# Patient Record
Sex: Male | Born: 1995 | Race: Black or African American | Hispanic: No | Smoking: Never smoker
Health system: Southern US, Community
[De-identification: ages and names within clinical notes are randomized; demographics above are authoritative.]

---
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
## 2019-09-06 ENCOUNTER — Other Ambulatory Visit: Payer: Self-pay

## 2019-09-06 ENCOUNTER — Emergency Department (HOSPITAL_BASED_OUTPATIENT_CLINIC_OR_DEPARTMENT_OTHER)
Admission: EM | Admit: 2019-09-06 | Discharge: 2019-09-06 | Disposition: A | Discharge status: Home / Self Care | Payer: Managed Care, Other (non HMO) | Attending: Emergency Medicine | Admitting: Emergency Medicine

## 2019-09-06 ENCOUNTER — Emergency Department (HOSPITAL_BASED_OUTPATIENT_CLINIC_OR_DEPARTMENT_OTHER): Payer: Managed Care, Other (non HMO)

## 2019-09-06 ENCOUNTER — Encounter (HOSPITAL_BASED_OUTPATIENT_CLINIC_OR_DEPARTMENT_OTHER): Payer: Self-pay | Admitting: Emergency Medicine

## 2019-09-06 DIAGNOSIS — S9781XA Crushing injury of right foot, initial encounter: Secondary | ICD-10-CM | POA: Diagnosis present

## 2019-09-06 DIAGNOSIS — Y939 Activity, unspecified: Secondary | ICD-10-CM | POA: Diagnosis not present

## 2019-09-06 DIAGNOSIS — Y929 Unspecified place or not applicable: Secondary | ICD-10-CM | POA: Insufficient documentation

## 2019-09-06 DIAGNOSIS — W231XXA Caught, crushed, jammed, or pinched between stationary objects, initial encounter: Secondary | ICD-10-CM | POA: Diagnosis not present

## 2019-09-06 DIAGNOSIS — S90811A Abrasion, right foot, initial encounter: Secondary | ICD-10-CM | POA: Diagnosis not present

## 2019-09-06 DIAGNOSIS — Y990 Civilian activity done for income or pay: Secondary | ICD-10-CM | POA: Diagnosis not present

## 2019-09-06 MED ORDER — HYDROCODONE-ACETAMINOPHEN 5-325 MG PO TABS: 1.0000 | ORAL_TABLET | Freq: Four times a day (QID) | ORAL | 0 refills | Status: AC | PRN

## 2019-09-06 MED ORDER — NAPROXEN 250 MG PO TABS
500.0000 mg | ORAL_TABLET | Freq: Once | ORAL | Status: AC
  Administered 2019-09-06: 500 mg via ORAL
  Filled 2019-09-06: qty 2

## 2019-09-06 NOTE — ED Triage Notes (Signed)
Pt has injury to right foot from being crushed between a pallet jack and wall. Pt has skin tear to lateral foot.

## 2019-09-06 NOTE — ED Provider Notes (Signed)
[facility] Provider Note: Georgena Spurling, MD, FACEP  CSN: [id number] MRN: [id number] ARRIVAL: 09/06/19 at [place]: [place] [facility]   HISTORY OF PRESENT ILLNESS  09/06/19 1:38 AM Erik Reynolds is a 24 y.o. male who his right foot got caught between a pallet jack and a wall at work just prior to arrival.  He has an abrasion overlying his right first metatarsal.  He has associated pain in his right foot which he rates as a 9 out of 10 and describes it as sharp and throbbing.  It is worse with palpation or movement.  His tetanus is up-to-date.   History reviewed. No pertinent past medical history.  History reviewed. No pertinent surgical history.  No family history on file.  Social History   Tobacco Use  . Smoking status: Never Smoker  . Smokeless tobacco: Never Used  Substance Use Topics  . Alcohol use: No  . Drug use: No    Prior to Admission medications   Medication Sig Start Date End Date Taking? Authorizing Provider  HYDROcodone-acetaminophen (NORCO) 5-325 MG tablet Take 1-2 tablets by mouth every 6 (six) hours as needed for severe pain. 09/06/19   Breda Bond, Jenny Reichmann, MD    Allergies Patient has no known allergies.   REVIEW OF SYSTEMS  Negative except as noted here or in the History of Present Illness.   PHYSICAL EXAMINATION  Initial Vital Signs Blood pressure (!) 132/95, pulse 79, temperature 98.4 F (36.9 C), temperature source Oral, resp. rate 18, height 6' (1.829 m), weight 77.1 kg, SpO2 100 %.  Examination General: Well-developed, well-nourished male in no acute distress; appearance consistent with age of record HENT: normocephalic; atraumatic Eyes: Normal appearance Neck: supple Heart: regular rate and rhythm Lungs: clear to auscultation bilaterally Abdomen: soft; nondistended; nontender; bowel sounds present Extremities: No deformity; pulses normal; swelling, abrasion and tenderness overlying right first metatarsal,  right foot distally neurovascularly intact with intact tendon function except limited exam of great toe due to pain and swelling Neurologic: Awake, alert and oriented; motor function intact in all extremities and symmetric; no facial droop Skin: Warm and dry Psychiatric: Normal mood and affect   RESULTS  Summary of this visit's results, reviewed and interpreted by myself:   EKG Interpretation  Date/Time:    Ventricular Rate:    PR Interval:    QRS Duration:   QT Interval:    QTC Calculation:   R Axis:     Text Interpretation:        Laboratory Studies: No results found for this or any previous visit (from the past 24 hour(s)). Imaging Studies: DG Foot Complete Right  Result Date: 09/06/2019 CLINICAL DATA:  Right foot pain after injury. Foot crushed between a pallet jack and wall. Skin tear. EXAM: RIGHT FOOT COMPLETE - 3+ VIEW COMPARISON:  Foot radiograph 12/22/2011 FINDINGS: There is no evidence of fracture or dislocation. There is no evidence of arthropathy or other focal bone abnormality. Dorsal soft tissue edema overlies the metatarsals. IMPRESSION: Dorsal soft tissue edema. No fracture or dislocation. Electronically Signed   By: Keith Rake M.D.   On: 09/06/2019 01:40    ED COURSE and MDM  Nursing notes, initial and subsequent vitals signs, including pulse oximetry, reviewed and interpreted by myself.  Vitals:   09/06/19 0111 09/06/19 0112 09/06/19 0114  BP:   (!) 132/95  Pulse:   79  Resp:   18  Temp:  98.4 F (36.9 C)   TempSrc:  Oral   SpO2:   100%  Weight: 77.1 kg    Height: 6' (1.829 m)     Medications  naproxen (NAPROSYN) tablet 500 mg (has no administration in time range)   No signs of a compartment syndrome at this time but this is a crush injury and patient was advised of concerning signs and symptoms that would warrant return.  Specifically he was advised to watch for pallor, paresthesia, increasing pain.  He was advised to keep the foot elevated and  to use ice.   PROCEDURES  Procedures   ED DIAGNOSES     ICD-10-CM   1. Crushing injury of right foot, initial encounter  S97.81XA   2. Abrasion, right foot, initial encounter  G64.403K        Paula Libra, MD 09/06/19 0150

## 2020-04-17 ENCOUNTER — Emergency Department (HOSPITAL_COMMUNITY)
Admission: EM | Admit: 2020-04-17 | Discharge: 2020-04-17 | Disposition: A | Discharge status: Home / Self Care | Payer: No Typology Code available for payment source | Attending: Emergency Medicine | Admitting: Emergency Medicine

## 2020-04-17 ENCOUNTER — Encounter (HOSPITAL_COMMUNITY): Payer: Self-pay

## 2020-04-17 ENCOUNTER — Emergency Department (HOSPITAL_COMMUNITY): Payer: No Typology Code available for payment source

## 2020-04-17 DIAGNOSIS — U071 COVID-19: Secondary | ICD-10-CM | POA: Diagnosis not present

## 2020-04-17 DIAGNOSIS — M79642 Pain in left hand: Secondary | ICD-10-CM | POA: Diagnosis not present

## 2020-04-17 DIAGNOSIS — R6883 Chills (without fever): Secondary | ICD-10-CM | POA: Diagnosis present

## 2020-04-17 LAB — SARS CORONAVIRUS 2 BY RT PCR (HOSPITAL ORDER, PERFORMED IN ~~LOC~~ HOSPITAL LAB): SARS Coronavirus 2: POSITIVE — AB

## 2020-04-17 MED ORDER — ACETAMINOPHEN 325 MG PO TABS
650.0000 mg | ORAL_TABLET | Freq: Once | ORAL | Status: AC
  Administered 2020-04-17: 650 mg via ORAL

## 2020-04-17 MED ORDER — BENZONATATE 100 MG PO CAPS: 100.0000 mg | ORAL_CAPSULE | Freq: Three times a day (TID) | ORAL | 0 refills | Status: AC

## 2020-04-17 NOTE — ED Provider Notes (Signed)
[facility] EMERGENCY DEPARTMENT Provider Note   CSN: [id number] Arrival date & time: 04/17/20  1701     History Chief Complaint  Patient presents with  . Chills  . Hand Pain    Erik Reynolds is a 24 y.o. male who presents to the ED today with complaint of gradual onset, constant, dry cough x 2 days. Pt also complains of chills and body aches. He is unsure if  He has been around anyone who is sick however he is not vaccinated. Pt was unaware he had a fever at home however found to have a temp of 102.5 in the ED today. Pt denies any shortness of breath, chest pain, sore throat, abdominal pain, nausea, vomiting, diarrhea.   Pt also complaining of gradual onset, constant, achy, left hand pain x 3 days. Pt reports that he does competitive fighting and hurt his hand 3 days ago. He states he went home that day and noticed swelling of his hand. He has been unable to move the 3rd finger due to swelling and pain. Pt did have a small wound on his finger; tetanus up to date. Pt denies any other symptoms.   The history is provided by the patient and medical records.       History reviewed. No pertinent past medical history.  There are no problems to display for this patient.   History reviewed. No pertinent surgical history.     History reviewed. No pertinent family history.  Social History   Tobacco Use  . Smoking status: Never Smoker  . Smokeless tobacco: Never Used  Substance Use Topics  . Alcohol use: No  . Drug use: No    Home Medications Prior to Admission medications   Medication Sig Start Date End Date Taking? Authorizing Provider  benzonatate (TESSALON) 100 MG capsule Take 1 capsule (100 mg total) by mouth every 8 (eight) hours. 04/17/20   Tanda Rockers, PA-C  HYDROcodone-acetaminophen (NORCO) 5-325 MG tablet Take 1-2 tablets by mouth every 6 (six) hours as needed for severe pain. 09/06/19   Molpus, Jonny Ruiz, MD    Allergies    Patient has no known  allergies.  Review of Systems   Review of Systems  Constitutional: Positive for chills, fatigue and fever.  Respiratory: Positive for cough. Negative for shortness of breath.   Cardiovascular: Negative for chest pain.  Gastrointestinal: Negative for abdominal pain, diarrhea, nausea and vomiting.  Musculoskeletal: Positive for arthralgias and myalgias.  Skin: Positive for wound (healing).  All other systems reviewed and are negative.   Physical Exam Updated Vital Signs BP (!) 128/56 (BP Location: Right Arm)   Pulse 98   Temp (!) 102.5 F (39.2 C)   Resp 20   Ht 6' (1.829 m)   Wt 78 kg   SpO2 98%   BMI 23.33 kg/m   Physical Exam Vitals and nursing note reviewed.  Constitutional:      Appearance: He is not ill-appearing or diaphoretic.  HENT:     Head: Normocephalic and atraumatic.  Eyes:     Conjunctiva/sclera: Conjunctivae normal.  Cardiovascular:     Rate and Rhythm: Normal rate and regular rhythm.     Pulses: Normal pulses.  Pulmonary:     Effort: Pulmonary effort is normal.     Breath sounds: Normal breath sounds. No wheezing, rhonchi or rales.     Comments: Actively coughing in room. Able to speak in full sentences without difficulty. Satting 98% on RA.  Musculoskeletal:  Comments: Swelling and ecchymosis noted to 3rd MCP joint with TTP. ROM limited at joint due to pain. ROM intact to PIP and DIP joint of same finger. No tenderness to remainder of hand or other fingers. Cap refill < 2 seconds. 2+ radial pulse.   Skin:    General: Skin is warm and dry.     Coloration: Skin is not jaundiced.  Neurological:     Mental Status: He is alert.     ED Results / Procedures / Treatments   Labs (all labs ordered are listed, but only abnormal results are displayed) Labs Reviewed  SARS CORONAVIRUS 2 BY RT PCR (HOSPITAL ORDER, PERFORMED IN [facility]) - Abnormal; Notable for the following components:      Result Value   SARS Coronavirus 2 POSITIVE (*)     All other components within normal limits    EKG None  Radiology DG Chest Port 1 View  Result Date: 04/17/2020 CLINICAL DATA:  COVID positive with cough. EXAM: PORTABLE CHEST 1 VIEW COMPARISON:  None. FINDINGS: There is no evidence of acute infiltrate, pleural effusion or pneumothorax. The heart size and mediastinal contours are within normal limits. The visualized skeletal structures are unremarkable. IMPRESSION: No active disease. Electronically Signed   By: Aram Candela M.D.   On: 04/17/2020 20:09   DG Hand Complete Left  Result Date: 04/17/2020 CLINICAL DATA:  Status post trauma. EXAM: LEFT HAND - COMPLETE 3+ VIEW COMPARISON:  None. FINDINGS: There is no evidence of fracture or dislocation. There is no evidence of arthropathy or other focal bone abnormality. Mild dorsal soft tissue swelling is seen along the metacarpal phalangeal articulations. IMPRESSION: Mild dorsal soft tissue swelling without evidence of acute fracture. Electronically Signed   By: Aram Candela M.D.   On: 04/17/2020 20:07    Procedures Procedures (including critical care time)  Medications Ordered in ED Medications  acetaminophen (TYLENOL) tablet 650 mg (650 mg Oral Given 04/17/20 1719)    ED Course  I have reviewed the triage vital signs and the nursing notes.  Pertinent labs & imaging results that were available during my care of the patient were reviewed by me and considered in my medical decision making (see chart for details).  Clinical Course as of Apr 17 2114  Wed Apr 17, 2020  1917 SARS Coronavirus 2(!): POSITIVE [MV]    Clinical Course User Index [MV] Tanda Rockers, PA-C   MDM Rules/Calculators/A&P                          24 year old male presents to the ED today with Covid-like symptoms, he is unvaccinated.  He is also complaining of left hand pain.  He does competitive fighting and injured his hand 3 days ago and has been unable to move his left middle finger since then.  On arrival  to the ED patient was noted to be febrile 100.5, nontachycardic and nontachypneic.  He had a Covid test done while in the waiting room which was positive.  On exam patient is well-appearing.  He is actively coughing in the room however able to speak in full sentences without difficulty and satting 98% on room air.  An x-ray of his left hand has been ordered, will order a portable chest x-ray as well.   Xray hand negative for acute fracture.  Chest x-ray within normal limits.  The patient's temp 3 taken 98.8.  He was given Tylenol while in the waiting room.  We will plan to discharge at this time with instructions to self isolate for 14 days.  Patient given work note.  He is instructed on return precautions.  He is a otherwise healthy male and does not meet criteria for monoclonal antibody infusion.  Will discharge home at this time.   This note was prepared using Dragon voice recognition software and may include unintentional dictation errors due to the inherent limitations of voice recognition software.  Erik Reynolds was evaluated in Emergency Department on 04/17/2020 for the symptoms described in the history of present illness. He was evaluated in the context of the global COVID-19 pandemic, which necessitated consideration that the patient might be at risk for infection with the SARS-CoV-2 virus that causes COVID-19. Institutional protocols and algorithms that pertain to the evaluation of patients at risk for COVID-19 are in a state of rapid change based on information released by regulatory bodies including the CDC and federal and state organizations. These policies and algorithms were followed during the patient's care in the ED.  Final Clinical Impression(s) / ED Diagnoses Final diagnoses:  COVID-19  Left hand pain    Rx / DC Orders ED Discharge Orders         Ordered    benzonatate (TESSALON) 100 MG capsule  Every 8 hours        04/17/20 2059           Discharge Instructions     You  have tested POSITIVE for COVID 19 today. Please stay home and self isolate for the next 14 days (cleared 09/16). Continue monitoring your symptoms at home. Buy a thermometer to check your temperature and take Tylenol as needed if your temperature exceeds 100.4. Drink plenty of fluids to stay hydrated. I have also prescribed a cough medication for you to take.   Your hand xray did not show any breaks. You are likely having pain secondary to soft tissue swelling. Please apply ice to the hand. While at home please elevate on 2-3 pillows to reduce inflammation/swelling. You can take Ibuprofen as needed for the pain.   Return to the ED IMMEDIATELY for any worsening symptoms including shortness of breath, severe chest pain, passing out, lips or fingertips turning blue, or any other new/concerning symptoms.        Tanda Rockers, PA-C 04/17/20 2115    Margarita Grizzle, MD 04/17/20 [phone]

## 2020-04-17 NOTE — Discharge Instructions (Addendum)
You have tested POSITIVE for COVID 19 today. Please stay home and self isolate for the next 14 days (cleared 09/16). Continue monitoring your symptoms at home. Buy a thermometer to check your temperature and take Tylenol as needed if your temperature exceeds 100.4. Drink plenty of fluids to stay hydrated. I have also prescribed a cough medication for you to take.   Your hand xray did not show any breaks. You are likely having pain secondary to soft tissue swelling. Please apply ice to the hand. While at home please elevate on 2-3 pillows to reduce inflammation/swelling. You can take Ibuprofen as needed for the pain.   Return to the ED IMMEDIATELY for any worsening symptoms including shortness of breath, severe chest pain, passing out, lips or fingertips turning blue, or any other new/concerning symptoms.
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
## 2020-04-17 NOTE — ED Notes (Signed)
Reviewed discharge instructions with patient. Follow-up care and medications reviewed. Patient  verbalized understanding. Patient A&Ox4, VSS, and ambulatory with steady gait upon discharge.  °
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
